# Patient Record
Sex: Male | Born: 1950 | Race: White | Hispanic: No | Marital: Married | Smoking: Former smoker
Health system: Southern US, Community
[De-identification: ages and names within clinical notes are randomized; demographics above are authoritative.]

## PROBLEM LIST (undated history)

## (undated) DIAGNOSIS — C61 Malignant neoplasm of prostate: Secondary | ICD-10-CM

## (undated) DIAGNOSIS — C439 Malignant melanoma of skin, unspecified: Secondary | ICD-10-CM

## (undated) HISTORY — DX: Malignant neoplasm of prostate: C61

## (undated) HISTORY — DX: Malignant melanoma of skin, unspecified: C43.9

---

## 2007-02-16 HISTORY — PX: JOINT REPLACEMENT: SHX530

## 2017-07-22 ENCOUNTER — Other Ambulatory Visit (HOSPITAL_COMMUNITY): Payer: Self-pay | Admitting: Urology

## 2017-07-22 DIAGNOSIS — R972 Elevated prostate specific antigen [PSA]: Secondary | ICD-10-CM

## 2017-07-28 ENCOUNTER — Telehealth: Payer: Self-pay | Admitting: *Deleted

## 2017-07-29 ENCOUNTER — Ambulatory Visit (HOSPITAL_COMMUNITY)
Admission: RE | Admit: 2017-07-29 | Discharge: 2017-07-29 | Disposition: A | Discharge status: Home / Self Care | Payer: Medicare HMO | Source: Ambulatory Visit | Attending: Urology | Admitting: Urology

## 2017-07-29 ENCOUNTER — Ambulatory Visit (HOSPITAL_COMMUNITY): Payer: Medicare HMO

## 2017-07-29 ENCOUNTER — Encounter (HOSPITAL_COMMUNITY): Payer: Self-pay

## 2017-07-29 DIAGNOSIS — N429 Disorder of prostate, unspecified: Secondary | ICD-10-CM | POA: Diagnosis not present

## 2017-07-29 DIAGNOSIS — K5730 Diverticulosis of large intestine without perforation or abscess without bleeding: Secondary | ICD-10-CM | POA: Insufficient documentation

## 2017-07-29 DIAGNOSIS — R972 Elevated prostate specific antigen [PSA]: Secondary | ICD-10-CM | POA: Diagnosis present

## 2017-07-29 LAB — CREATININE, SERUM
Creatinine, Ser: 1.07 mg/dL (ref 0.61–1.24)
GFR calc Af Amer: >60 mL/min (ref >60)

## 2017-07-29 MED ORDER — GADOBENATE DIMEGLUMINE 529 MG/ML IV SOLN
18.0000 mL | Freq: Once | INTRAVENOUS | Status: AC | PRN
  Administered 2017-07-29: 18 mL via INTRAVENOUS

## 2017-11-08 DIAGNOSIS — C61 Malignant neoplasm of prostate: Secondary | ICD-10-CM

## 2017-11-08 HISTORY — DX: Malignant neoplasm of prostate: C61

## 2017-11-08 HISTORY — PX: PROSTATE SURGERY: SHX751

## 2018-03-08 ENCOUNTER — Ambulatory Visit: Payer: Medicare HMO | Attending: Radiation Oncology | Admitting: Physical Therapy

## 2018-03-08 ENCOUNTER — Other Ambulatory Visit: Payer: Self-pay

## 2018-03-08 ENCOUNTER — Encounter: Payer: Self-pay | Admitting: Physical Therapy

## 2018-03-08 DIAGNOSIS — R278 Other lack of coordination: Secondary | ICD-10-CM | POA: Diagnosis present

## 2018-03-08 DIAGNOSIS — M6281 Muscle weakness (generalized): Secondary | ICD-10-CM | POA: Diagnosis present

## 2018-03-08 DIAGNOSIS — C61 Malignant neoplasm of prostate: Secondary | ICD-10-CM | POA: Diagnosis present

## 2018-03-08 NOTE — Patient Instructions (Addendum)
Certain foods and liquids will decrease the pH making the urine more acidic.  Urinary urgency increases when the urine has a low pH.  Most common irritants: alcohol, carbonated beverages and caffinated beverages.  Foods to avoid: apple juice, apples, ascorbic acid, canteloupes, chili, citrus fruits, coffee, cranberries, grapes, guava, peaches, pepper, pineapple, plums, strawberries, tea, tomatoes, and vinegar.  Drinking plenty of water may help to increase the pH and dilute out any of the effects of specific irritants.  Foods that are NOT irritating to the bladder include: Pears, papayas, sun-brewed teas, watermelons, non-citrus herbal teas, apricots, kava and low-acid instant drinks (Postum)  Scar Massage  Scar massage is done to improve the mobility of scar, decrease scar tissue from building up, reduce adhesions, and prevent Keloids from forming. Start scar massage after scabs have fallen off by themselves and no open areas. The first few weeks after surgery, it is normal for a scar to appear pink or red and slightly raised. Scars can itch or have areas of numbness. Some scars may be sensitive.   Direct Scar massage: after scar is healed, no opening, no scab 1.  Place pads of two fingers together directly on the scar starting at one end of the scar. Move the fingers up and down across the scar holding 5 seconds one direction.  Then go opposite direction hold 5 seconds.  2. Move over to the next section of the scar and repeat.  Work your way along the entire length of the scar.   3. Next make diagonal movements along the scar holding 5 seconds at one direction. 4. Next movement is side to side. 5. Do not rub fingers over the scar.  Instead keep firm pressure and move scar over the tissue it is on top   Scar Lift and Roll 12 weeks after surgery. 1. Pinch a small amount of the scar between your first two fingers and thumb.  2. Roll the scar between your fingers for 5 to 15 seconds. 3. Move  along the scar and repeat until you have massaged the entire length of scar.   Stop the massage and call your doctor if you notice: 1. Increased redness 2. Bleeding from scar 3. Seepage coming from the scar 4. Scar is warmer and has increased pain   Slow Contraction: Gravity Resisted (Sitting)    Sitting, slowly squeeze pelvic floor for _5__ seconds. Rest for _5__ seconds. Repeat __5_ times. Do _5__ times a day.  Copyright  VHI. All rights reserved.  Slow Contraction: Gravity Resisted (Sitting)    Sitting, slowly squeeze pelvic floor for __1_ seconds. Rest for _1__ seconds. Repeat _5__ times. Do __5_ times a day.  Copyright  VHI. All rights reserved.  [facility] [address] [place], [place] [address] Phone # [phone] Fax [phone]

## 2018-03-08 NOTE — Therapy (Signed)
[facility] [address] [place], [place], [address] Phone: [phone]   Fax:  [phone]  Physical Therapy Evaluation  Patient Details  Name: Justin Oliver MRN: [id number] Date of Birth: [birth year] Referring Provider (PT): Dr. Thea Silversmith   Encounter Date: 03/08/2018  PT End of Session - 03/08/18 1153    Visit Number  1    Date for PT Re-Evaluation  05/03/18    Authorization Type  [employer]na Medicare    PT Start Time  1100    PT Stop Time  1145    PT Time Calculation (min)  45 min    Activity Tolerance  Patient tolerated treatment well    Behavior During Therapy  [facility] for tasks assessed/performed       Past Medical History:  Diagnosis Date  . Melanoma ([place])   . Prostate CA ([place]) 11/08/2017    Past Surgical History:  Procedure Laterality Date  . JOINT REPLACEMENT Left 2009   TKA  . PROSTATE SURGERY  11/08/2017   prostate cancer    There were no vitals filed for this visit.   Subjective Assessment - 03/08/18 1109    Subjective  Patient had prostectomy on 11/08/2017. No physical therapy afterwards. Dr. Jonette Eva performed robotic surgery. Patient decided to postpone radiation. Patient has a trace of PSA. Patient has urgency. If patient waits to go to  bathroom will leak urine. Patient wears disposable underwear, 1 per day, 1 at night. No urinary leakage at night.     Patient Stated Goals  strengthen the pelvic floor muscles    Currently in Pain?  No/denies    Multiple Pain Sites  No         [facility] PT Assessment - 03/08/18 0001      Assessment   Medical Diagnosis  C61 malignant neoplasm of prostate    Referring Provider (PT)  Dr. Thea Silversmith    Onset Date/Surgical Date  11/08/17    Prior Therapy  none      Precautions   Precautions  Other (comment)    Precaution Comments  cancer -prostate, melanoma      Restrictions   Weight Bearing Restrictions  No      Balance Screen   Has the patient  fallen in the past 6 months  No    Has the patient had a decrease in activity level because of a fear of falling?   No    Is the patient reluctant to leave their home because of a fear of falling?   No      Home Film/video editor residence      Prior Function   Level of Independence  Independent    Vocation Requirements  sitting    Leisure  golf      ROM / Strength   AROM / PROM / Strength  Strength      Strength   Left Hip Flexion  4/5    Left Hip Extension  4/5                Objective measurements completed on examination: See above findings.    Pelvic Floor Special Questions - 03/08/18 0001    Urinary Leakage  Yes    Pad use  2 disposable diapers    Activities that cause leaking  With strong urge    Urinary urgency  Yes    Biofeedback  sitting quick flick to 30 uv, hold 5 sec 15  uv with VC to breath    Biofeedback sensor type  Surface   rectal              PT Education - 03/08/18 1154    Education Details  information on bladder irritants, scar massage, pelvic floor contraction  in sitting    Person(s) Educated  Patient    Methods  Explanation;Demonstration;Verbal cues;Handout    Comprehension  Returned demonstration;Verbalized understanding       PT Short Term Goals - 03/08/18 1207      PT SHORT TERM GOAL #1   Title  independent with initial HEP    Time  4    Period  Weeks    Status  New    Target Date  05/03/18      PT SHORT TERM GOAL #2   Title  understand what bladder irritants are and how they affect the bladder and urinary leakage    Time  4    Period  Weeks    Status  New    Target Date  04/05/18      PT SHORT TERM GOAL #3   Title  urinary leakage when he has the urge decreased >/= 25%     Time  4    Period  Weeks    Status  New    Target Date  04/05/18      PT SHORT TERM GOAL #4   Title  abilty to hold 5 second pelvic floor contraction in sitting with correct breathing pattern    Time  4     Period  Weeks    Status  New    Target Date  04/05/18      PT SHORT TERM GOAL #5   Title  ability to go from sit to stand with correct breathing pattern so he is not bearing down causing urinary leakage    Time  4    Period  Weeks    Status  New    Target Date  04/05/18        PT Long Term Goals - 03/08/18 1212      PT LONG TERM GOAL #1   Title  independent with HEP and understand how to progress    Time  8    Period  Weeks    Status  New    Target Date  05/03/18      PT LONG TERM GOAL #2   Title  urinary leakage with going from sit to stand when he has the strong urge to urinate decreased >/= 80%    Time  8    Period  Weeks    Status  New    Target Date  05/03/18      PT LONG TERM GOAL #3   Title  abilty to walk to the bathroom when he has a strong urge to urinate decreased >/= 80% due to increased endurance of pelvic floor muscles    Time  8    Period  Weeks    Status  New    Target Date  05/03/18      PT LONG TERM GOAL #4   Title  ability to hold his pelvic floor for 30 seconds while breathing above 15 uv    Time  8    Period  Weeks    Status  New    Target Date  05/03/18             Plan - 03/08/18 1229  Clinical Impression Statement  Patient is a 68 year old male s/p prostectomy, 11/08/2017,  due to prostate cancer. Patient reports urinary leakage when he has the urge to urinate and going from sit to stand and walking to the commode. Patient reports he will leak urine when he drinks caffiene or alcoholic beverage. Patient has weakness in left hip extension and flexion. Patient abdominal scars have thickness and decreased mobility. Patient pelvic floor contracts to 10XN with quick flicks and 15 uv with 5 second holds. Patient needs verbal cues to breath with pelvic floor contraction for 5seconds. Patient wears 2 disposable diapers during the day and no leakage at night. Patient will benefit from skilled therapy to improve pelvic floor coordination and  strength to reduce leakage.     History and Personal Factors relevant to plan of care:  prostate cancer; melanoma, prostectomy 11/08/2017    Clinical Presentation  Evolving    Clinical Presentation due to:  leaks urine when he has strong urge, going to sit to stand and walking to the commode    Clinical Decision Making  Moderate    Rehab Potential  Excellent    Clinical Impairments Affecting Rehab Potential  prostate cancer; melanoma, prostectomy 11/08/2017    PT Frequency  Biweekly    PT Duration  8 weeks    PT Treatment/Interventions  Biofeedback;Therapeutic activities;Therapeutic exercise;Neuromuscular re-education;Manual techniques;Patient/family education;Scar mobilization;Dry needling    PT Next Visit Plan  soft tissue work to abdomen and diaphragm, pelvic floor contraction for 10 seconds, sit to stand and walking, core strength    Consulted and Agree with Plan of Care  Patient       Patient will benefit from skilled therapeutic intervention in order to improve the following deficits and impairments:  Increased fascial restricitons, Decreased coordination, Decreased scar mobility, Decreased endurance, Decreased strength  Visit Diagnosis: Muscle weakness (generalized) - Plan: PT plan of care cert/re-cert  Other lack of coordination - Plan: PT plan of care cert/re-cert  Prostate cancer [facility]) - Plan: PT plan of care cert/re-cert     Problem List There are no active problems to display for this patient.   Earlie Counts, PT 03/08/18 12:39 PM   [facility] [address], [place], [place], [address] Phone: [phone]   Fax:  [phone]  Name: Justin Oliver MRN: [id number] Date of Birth: 06-29-1950

## 2018-03-21 ENCOUNTER — Encounter: Payer: Self-pay | Admitting: Physical Therapy

## 2018-03-21 ENCOUNTER — Ambulatory Visit: Payer: Medicare HMO | Attending: Radiation Oncology | Admitting: Physical Therapy

## 2018-03-21 DIAGNOSIS — C61 Malignant neoplasm of prostate: Secondary | ICD-10-CM | POA: Insufficient documentation

## 2018-03-21 DIAGNOSIS — M6281 Muscle weakness (generalized): Secondary | ICD-10-CM

## 2018-03-21 DIAGNOSIS — R278 Other lack of coordination: Secondary | ICD-10-CM | POA: Insufficient documentation

## 2018-03-21 NOTE — Patient Instructions (Addendum)
About Abdominal Massage  Abdominal massage, also called external colon massage, is a self-treatment circular massage technique that can reduce and eliminate gas and ease constipation. The colon naturally contracts in waves in a clockwise direction starting from inside the right hip, moving up toward the ribs, across the belly, and down inside the left hip.  When you perform circular abdominal massage, you help stimulate your colon's normal wave pattern of movement called peristalsis.  It is most beneficial when done after eating.  Positioning You can practice abdominal massage with oil while lying down, or in the shower with soap.  Some people find that it is just as effective to do the massage through clothing while sitting or standing.  How to Massage Start by placing your finger tips or knuckles on your right side, just inside your hip bone.  . Make small circular movements while you move upward toward your rib cage.   . Once you reach the bottom right side of your rib cage, take your circular movements across to the left side of the bottom of your rib cage.  . Next, move downward until you reach the inside of your left hip bone.  This is the path your feces travel in your colon. . Continue to perform your abdominal massage in this pattern for 10 minutes each day.     You can apply as much pressure as is comfortable in your massage.  Start gently and build pressure as you continue to practice.  Notice any areas of pain as you massage; areas of slight pain may be relieved as you massage, but if you have areas of significant or intense pain, consult with your healthcare provider.  Other Considerations . General physical activity including bending and stretching can have a beneficial massage-like effect on the colon.  Deep breathing can also stimulate the colon because breathing deeply activates the same nervous system that supplies the colon.   . Abdominal massage should always be used in  combination with a bowel-conscious diet that is high in the proper type of fiber for you, fluids (primarily water), and a regular exercise program. Toileting Techniques for Bowel Movements (Defecation) Using your belly (abdomen) and pelvic floor muscles to have a bowel movement is usually instinctive.  Sometimes people can have problems with these muscles and have to relearn proper defecation (emptying) techniques.  If you have weakness in your muscles, organs that are falling out, decreased sensation in your pelvis, or ignore your urge to go, you may find yourself straining to have a bowel movement.  You are straining if you are: . holding your breath or taking in a huge gulp of air and holding it  . keeping your lips and jaw tensed and closed tightly . turning red in the face because of excessive pushing or forcing . developing or worsening your  hemorrhoids . getting faint while pushing . not emptying completely and have to defecate many times a day  If you are straining, you are actually making it harder for yourself to have a bowel movement.  Many people find they are pulling up with the pelvic floor muscles and closing off instead of opening the anus. Due to lack pelvic floor relaxation and coordination the abdominal muscles, one has to work harder to push the feces out.  Many people have never been taught how to defecate efficiently and effectively.  Notice what happens to your body when you are having a bowel movement.  While you are sitting on the   toilet pay attention to the following areas: . Jaw and mouth position . Angle of your hips   . Whether your feet touch the ground or not . Arm placement  . Spine position . Waist . Belly tension . Anus (opening of the anal canal)  An Evacuation/Defecation Plan   Here are the 4 basic points:  1. Lean forward enough for your elbows to rest on your knees 2. Support your feet on the floor or use a low stool if your feet don't touch the floor    3. Push out your belly as if you have swallowed a beach ball-you should feel a widening of your waist 4. Open and relax your pelvic floor muscles, rather than tightening around the anus      The following conditions my require modifications to your toileting posture:  . If you have had surgery in the past that limits your back, hip, pelvic, knee or ankle flexibility . Constipation   Your healthcare practitioner may make the following additional suggestions and adjustments:  1) Sit on the toilet  a) Make sure your feet are supported. b) Notice your hip angle and spine position-most people find it effective to lean forward or raise their knees, which can help the muscles around the anus to relax  c) When you lean forward, place your forearms on your thighs for support  2) Relax suggestions a) Breath deeply in through your nose and out slowly through your mouth as if you are smelling the flowers and blowing out the candles. b) To become aware of how to relax your muscles, contracting and releasing muscles can be helpful.  Pull your pelvic floor muscles in tightly by using the image of holding back gas, or closing around the anus (visualize making a circle smaller) and lifting the anus up and in.  Then release the muscles and your anus should drop down and feel open. Repeat 5 times ending with the feeling of relaxation. c) Keep your pelvic floor muscles relaxed; let your belly bulge out. d) The digestive tract starts at the mouth and ends at the anal opening, so be sure to relax both ends of the tube.  Place your tongue on the roof of your mouth with your teeth separated.  This helps relax your mouth and will help to relax the anus at the same time.  3) Empty (defecation) a) Keep your pelvic floor and sphincter relaxed, then bulge your anal muscles.  Make the anal opening wide.  b) Stick your belly out as if you have swallowed a beach ball. c) Make your belly wall hard using your belly  muscles while continuing to breathe. Doing this makes it easier to open your anus. d) Breath out and give a grunt (or try using other sounds such as ahhhh, shhhhh, ohhhh or grrrrrrr).  4) Finish a) As you finish your bowel movement, pull the pelvic floor muscles up and in.  This will leave your anus in the proper place rather than remaining pushed out and down. If you leave your anus pushed out and down, it will start to feel as though that is normal and give you incorrect signals about needing to have a bowel movement.    Slow Contraction: Gravity Resisted (Sitting)    Sitting, slowly squeeze pelvic floor for _10_ seconds. Rest for _5__ seconds. Repeat __5_ times. Do _5__ times a day.  Copyright  VHI. All rights reserved.  Slow Contraction: Gravity Resisted (Sitting)    Sitting, slowly squeeze  pelvic floor for __1_ seconds. Rest for _1__ seconds. Repeat _5__ times. Do __5_ times a day.   Slow Contraction: Gravity Resisted (Standing)    Standing, slowly squeeze pelvic floor for _5__ seconds. Rest for _5__ seconds. Repeat _5__ times. Do __2_ times a day.  Copyright  VHI. All rights reserved.  [facility] [address], [place], [place] [address] Phone # [phone] Fax [phone]

## 2018-03-21 NOTE — Therapy (Signed)
[facility] [address], [place], [place], [address] Phone: [phone]   Fax:  [phone]  Physical Therapy Treatment  Patient Details  Name: Justin Oliver MRN: [id number] Date of Birth: 04-18-1950 Referring Provider (PT): Dr. Thea Silversmith   Encounter Date: 03/21/2018  PT End of Session - 03/21/18 1216    Visit Number  2    Date for PT Re-Evaluation  05/03/18    Authorization Type  [employer]na Medicare    PT Start Time  7412    PT Stop Time  1100    PT Time Calculation (min)  45 min    Activity Tolerance  Patient tolerated treatment well    Behavior During Therapy  [facility] for tasks assessed/performed       Past Medical History:  Diagnosis Date  . Melanoma ([place])   . Prostate CA ([place]) 11/08/2017    Past Surgical History:  Procedure Laterality Date  . JOINT REPLACEMENT Left 2009   TKA  . PROSTATE SURGERY  11/08/2017   prostate cancer    There were no vitals filed for this visit.  Subjective Assessment - 03/21/18 1021    Subjective  I have reduce my coffee intake and it is helping. My urinary leakage is 40% better. I can sleep through the night. Patient can eliminate but not alot comes out. Patient gets up 2  times at night to go to the bathroom. Patient can make it to the bathroom without leakage.     Patient Stated Goals  strengthen the pelvic floor muscles    Currently in Pain?  No/denies    Multiple Pain Sites  No                       [facility] Adult PT Treatment/Exercise - 03/21/18 0001      Self-Care   Self-Care  Other Self-Care Comments    Other Self-Care Comments   discussed caffiene being a bladder irritant      Therapeutic Activites    Therapeutic Activities  Other Therapeutic Activities    Other Therapeutic Activities  toileting technique to improve constipation with correct breathing and sitting position      Neuro Re-ed    Neuro Re-ed Details   pelvic floor contraction in sitting  and standing with VC to breath and upright for best contraction; supine diaphgramatic breathing to expand the abdomen and move the diaphgram up and down.       Manual Therapy   Manual Therapy  Soft tissue mobilization    Soft tissue mobilization  scar massage to abdomen; soft tissue work to the diaphgram; abdominal massage to improve peristalic movement             PT Education - 03/21/18 1104    Education Details  pelvic floor contraction; abdominal massage; toileting technique    Person(s) Educated  Patient    Methods  Explanation;Demonstration;Verbal cues;Handout    Comprehension  Returned demonstration;Verbalized understanding       PT Short Term Goals - 03/21/18 1220      PT SHORT TERM GOAL #1   Title  independent with initial HEP    Time  4    Period  Weeks    Status  Achieved      PT SHORT TERM GOAL #2   Title  understand what bladder irritants are and how they affect the bladder and urinary leakage    Time  4    Period  Weeks  Status  Achieved      PT SHORT TERM GOAL #3   Title  urinary leakage when he has the urge decreased >/= 25%     Time  4    Period  Weeks    Status  On-going      PT SHORT TERM GOAL #4   Title  abilty to hold 5 second pelvic floor contraction in sitting with correct breathing pattern    Time  4    Period  Weeks    Status  On-going      PT SHORT TERM GOAL #5   Title  ability to go from sit to stand with correct breathing pattern so he is not bearing down causing urinary leakage    Time  4    Period  Weeks    Status  On-going        PT Long Term Goals - 03/08/18 1212      PT LONG TERM GOAL #1   Title  independent with HEP and understand how to progress    Time  8    Period  Weeks    Status  New    Target Date  05/03/18      PT LONG TERM GOAL #2   Title  urinary leakage with going from sit to stand when he has the strong urge to urinate decreased >/= 80%    Time  8    Period  Weeks    Status  New    Target Date   05/03/18      PT LONG TERM GOAL #3   Title  abilty to walk to the bathroom when he has a strong urge to urinate decreased >/= 80% due to increased endurance of pelvic floor muscles    Time  8    Period  Weeks    Status  New    Target Date  05/03/18      PT LONG TERM GOAL #4   Title  ability to hold his pelvic floor for 30 seconds while breathing above 15 uv    Time  8    Period  Weeks    Status  New    Target Date  05/03/18            Plan - 03/21/18 1216    Clinical Impression Statement  Patient is able to stay dry during the night. Patient will get up 2 times due to the urgency at night. Patient reports he is able to get to the bathroom in time without leakage at night. Patient leaks urine with coffee and is aware that it is a bladder irritant for him. Patient is now contracting the pelvic floor in standing and holding for 10 seconds in sitting but has some difficulty. Patient is aware of his posture with pelvic floor contraction. Patient will benefit from skilled therapy to improve pelvic floor coordinaiton and strength to reduce leakage.     Rehab Potential  Excellent    Clinical Impairments Affecting Rehab Potential  prostate cancer; melanoma, prostectomy 11/08/2017    PT Frequency  Biweekly    PT Duration  8 weeks    PT Treatment/Interventions  Biofeedback;Therapeutic activities;Therapeutic exercise;Neuromuscular re-education;Manual techniques;Patient/family education;Scar mobilization;Dry needling    PT Next Visit Plan  soft tissue work to abdomen and diaphragm, sit to stand and walking with pelvic EMG core strength; urge to void    Consulted and Agree with Plan of Care  Patient       Patient  will benefit from skilled therapeutic intervention in order to improve the following deficits and impairments:  Increased fascial restricitons, Decreased coordination, Decreased scar mobility, Decreased endurance, Decreased strength  Visit Diagnosis: Muscle weakness  (generalized)  Other lack of coordination  Prostate cancer ([place])     Problem List There are no active problems to display for this patient.   Earlie Counts, PT 03/21/18 12:22 PM    [facility] [address] [place], [place], [address] Phone: [phone]   Fax:  [phone]  Name: Enio Hornback MRN: [id number] Date of Birth: 1950/10/22

## 2018-04-04 ENCOUNTER — Ambulatory Visit: Payer: Medicare HMO | Admitting: Physical Therapy

## 2018-04-04 ENCOUNTER — Encounter: Payer: Self-pay | Admitting: Physical Therapy

## 2018-04-04 DIAGNOSIS — C61 Malignant neoplasm of prostate: Secondary | ICD-10-CM

## 2018-04-04 DIAGNOSIS — R278 Other lack of coordination: Secondary | ICD-10-CM

## 2018-04-04 DIAGNOSIS — M6281 Muscle weakness (generalized): Secondary | ICD-10-CM

## 2018-04-04 NOTE — Patient Instructions (Addendum)
Relaxation Exercises with the Urge to Void   When you experience an urge to void:  FIRST  Stop and stand very still    Sit down if you can    Don't move    You need to stay very still to maintain control  SECOND Squeeze your pelvic floor muscles 5 times, like a quick flick, to keep from leaking  THIRD Relax  Take a deep breath and then let it out  Try to make the urge go away by using relaxation and visualization techniques  FINALLY When you feel the urge go away somewhat, walk normally to the bathroom.   If the urge gets suddenly stronger on the way, you may stop again and relax to regain control.  Hook-Lying    Lie with hips and knees bent. Allow body's muscles to relax. Place hands on belly. Inhale slowly and deeply for _3__ seconds, so hands move up. Then take __3_ seconds to exhale. Repeat _5__ times. When you have the urge to void.  Copyright  VHI. All rights reserved.  Sitting    Sit comfortably. Allow body's muscles to relax. Place hands on belly. Inhale slowly and deeply for __3_ seconds, so hands move out. Then take _3__ seconds to exhale. Repeat __5_ times. When you have the urge to void. Copyright  VHI. All rights reserved.  Slow Contraction: Gravity Resisted (Sitting)    Sitting, slowly squeeze pelvic floor for __10_ seconds. Rest for _5__ seconds. Repeat _5__ times. Do __5_ times a day. Eventually to so 30 seconds. Sit upright.  End with 5 quick flicks.  Slow Contraction: Gravity Resisted (Standing)    Standing, slowly squeeze pelvic floor for _10__ seconds. Rest for _5__ seconds. Repeat _5__ times. Do __2_ times a day. Contract to 50% to keep endurance.  End with 5 quick flicks.  Bracing With Curl-Up (Hook-Lying)    With neutral spine, tighten pelvic floor and abdominals and hold. Lift head and shoulders. Breath out  Repeat __10_ times. Do __1_ times a day. Alternate arm positions: Fold arms across chest. Support neck with clasped hands. Do  this with your trainer    Copyright  VHI. All rights reserved.  Bracing With Diagonal Curl-Up (Hook-Lying)    With neutral spine, tighten pelvic floor and abdominals and hold. Lift head and shoulder and rotate to one side. Breath out. Repeat _10__ times, alternating sides. Do _1__ times a day. Alternate arm positions: Fold arms across chest. Support neck with clasped hands. Do when you workout with your trainer.   Copyright  VHI. All rights reserved.  Side Bend: Caudal - Side-Lying    Lying on right hip and elbow, lift pelvis, keeping elbow and feet on board. Hold for 10 sec Repeat __3__ times per set. Do ___1_ sets per session. Do _1___ sessions per week. Contract pelvic floor, Keep mouth open to breath Copyright  VHI. All rights reserved.  Plank    Support body on hands and feet. Keep hips in line with torso and arms straight under chest. Avoid locking elbows. Contract pelvic floor. Keep mouth open to breath.  Hold for ____ breaths. Repeat with other leg. ADVANCED: Extend one leg up.  Copyright  VHI. All rights reserved.

## 2018-04-04 NOTE — Therapy (Signed)
[facility] [address] [place], [place], [address] Phone: [phone]   Fax:  [phone]  Physical Therapy Treatment  Patient Details  Name: Justin Oliver MRN: [id number] Date of Birth: 16-Feb-1951 Referring Provider (PT): Dr. Thea Silversmith   Encounter Date: 04/04/2018  PT End of Session - 04/04/18 1059    Visit Number  3    Date for PT Re-Evaluation  05/03/18    Authorization Type  [employer]na Medicare    PT Start Time  8416    PT Stop Time  1053    PT Time Calculation (min)  38 min    Activity Tolerance  Patient tolerated treatment well    Behavior During Therapy  [facility] for tasks assessed/performed       Past Medical History:  Diagnosis Date  . Melanoma ([place])   . Prostate CA ([place]) 11/08/2017    Past Surgical History:  Procedure Laterality Date  . JOINT REPLACEMENT Left 2009   TKA  . PROSTATE SURGERY  11/08/2017   prostate cancer    There were no vitals filed for this visit.  Subjective Assessment - 04/04/18 1019    Subjective  Overall the leakage is improving is 50%. Patient trigger is for urinary leakage is worse with drinking alchol and caffiene. I workout at the gym 2 days per week.      Patient Stated Goals  strengthen the pelvic floor muscles    Currently in Pain?  No/denies    Multiple Pain Sites  No                       [facility] Adult PT Treatment/Exercise - 04/04/18 0001      Self-Care   Self-Care  Other Self-Care Comments    Other Self-Care Comments   urge to void and use breathing to calm the bladder down      Lumbar Exercises: Standing   Other Standing Lumbar Exercises  pelvic floor contraction holding for 10 seconds      Lumbar Exercises: Seated   Other Seated Lumbar Exercises  pelvic floor contraction holding for 10 seconds      Lumbar Exercises: Supine   Ab Set  10 reps;5 seconds   tactile cues    Other Supine Lumbar Exercises  curl-up and diagonal  curl up with  pelvic floor contraction, breathing and  contracting the lower abdominal      Lumbar Exercises: Prone   Other Prone Lumbar Exercises  Plank and side plank with core stabilization and pelvic floro contraction              PT Education - 04/04/18 1023    Education Details  educated patient on doing 30 minutes of cardio with his workout. pelvic floor contraction in sitting and standing holding for 10 seconds; Planks with pelvic floor contraction; curl up with pelvic floor contraction    Person(s) Educated  Patient    Methods  Explanation;Demonstration;Verbal cues;Handout    Comprehension  Verbalized understanding;Returned demonstration       PT Short Term Goals - 04/04/18 1103      PT SHORT TERM GOAL #3   Title  urinary leakage when he has the urge decreased >/= 25%     Time  4    Period  Weeks    Status  Achieved      PT SHORT TERM GOAL #4   Title  abilty to hold 5 second pelvic floor contraction in sitting with correct  breathing pattern    Time  4    Period  Weeks    Status  Achieved      PT SHORT TERM GOAL #5   Title  ability to go from sit to stand with correct breathing pattern so he is not bearing down causing urinary leakage    Time  4    Period  Weeks    Status  Achieved        PT Long Term Goals - 03/08/18 1212      PT LONG TERM GOAL #1   Title  independent with HEP and understand how to progress    Time  8    Period  Weeks    Status  New    Target Date  05/03/18      PT LONG TERM GOAL #2   Title  urinary leakage with going from sit to stand when he has the strong urge to urinate decreased >/= 80%    Time  8    Period  Weeks    Status  New    Target Date  05/03/18      PT LONG TERM GOAL #3   Title  abilty to walk to the bathroom when he has a strong urge to urinate decreased >/= 80% due to increased endurance of pelvic floor muscles    Time  8    Period  Weeks    Status  New    Target Date  05/03/18      PT LONG TERM GOAL #4   Title  ability  to hold his pelvic floor for 30 seconds while breathing above 15 uv    Time  8    Period  Weeks    Status  New    Target Date  05/03/18            Plan - 04/04/18 1030    Clinical Impression Statement  Patient will leak urine when he drinks coffee and alcohol. Patient continues to get the urge to urinate and learned techniques to reduce. Patient urinary leakage decreased by 50%. Patient has learned how to work with the trainer while engaging the pelvic floor and contracting the abdominals correctly. Patient will benefit from skilled therapy to improve pelvic floor coordination and strength to reduce leakage.     Rehab Potential  Excellent    Clinical Impairments Affecting Rehab Potential  prostate cancer; melanoma, prostectomy 11/08/2017    PT Frequency  Biweekly    PT Treatment/Interventions  Biofeedback;Therapeutic activities;Therapeutic exercise;Neuromuscular re-education;Manual techniques;Patient/family education;Scar mobilization;Dry needling    PT Next Visit Plan  longer pelvic floor contraction to 15 seconds, urge to void    Consulted and Agree with Plan of Care  Patient       Patient will benefit from skilled therapeutic intervention in order to improve the following deficits and impairments:  Increased fascial restricitons, Decreased coordination, Decreased scar mobility, Decreased endurance, Decreased strength  Visit Diagnosis: Muscle weakness (generalized)  Other lack of coordination  Prostate cancer ([place])     Problem List There are no active problems to display for this patient.   Earlie Counts, PT 04/04/18 11:04 AM   [facility] [address], [place], [place], [address] Phone: [phone]   Fax:  [phone]  Name: Justin Oliver MRN: [id number] Date of Birth: April 24, 1950

## 2018-04-18 ENCOUNTER — Ambulatory Visit: Payer: Medicare HMO | Attending: Radiation Oncology | Admitting: Physical Therapy

## 2018-04-18 ENCOUNTER — Encounter: Payer: Self-pay | Admitting: Physical Therapy

## 2018-04-18 DIAGNOSIS — M6281 Muscle weakness (generalized): Secondary | ICD-10-CM | POA: Diagnosis present

## 2018-04-18 DIAGNOSIS — C61 Malignant neoplasm of prostate: Secondary | ICD-10-CM | POA: Insufficient documentation

## 2018-04-18 DIAGNOSIS — R278 Other lack of coordination: Secondary | ICD-10-CM | POA: Diagnosis present

## 2018-04-18 NOTE — Patient Instructions (Addendum)
Relaxation Exercises with the Urge to Void   When you experience an urge to void:  FIRST              Stop and stand very still                                     Sit down if you can                                     Don't move                                     You need to stay very still to maintain control  SECOND         Squeeze your pelvic floor muscles 5 times, like a quick flick, to keep from leaking  THIRD Relax             Take a deep breath and then let it out             Try to make the urge go away by using relaxation and visualization techniques  FINALLY         When you feel the urge go away somewhat, walk normally to the bathroom. If the urge gets suddenly stronger on the way, you may stop again and relax to regain control.       Slow Contraction: Gravity Resisted (Sitting)    Sitting, slowly squeeze pelvic floor for _15__ seconds work toward 30 seconds. Rest for _5__ seconds. Repeat __5_ times. Do __2_ times a day. End with 5 quick flicks Copyright  VHI. All rights reserved.    Slow Contraction: Gravity Resisted (Standing)    Standing, slowly squeeze pelvic floor for _10__ seconds work toward 30 sec. Rest for _5__ seconds. Repeat _5__ times. Do __2_ times a day. End off with 5 quick flicks Copyright  VHI. All rights reserved.   Breath with all strenuous activity   [facility] [address], [place], [place] [address] Phone # [phone] Fax [phone]

## 2018-04-18 NOTE — Therapy (Signed)
[facility] [address], [place], [place], [address] Phone: [phone]   Fax:  [phone]  Physical Therapy Treatment  Patient Details  Name: Justin Oliver MRN: [id number] Date of Birth: Nov 01, 1950 Referring Provider (PT): Dr. Thea Silversmith   Encounter Date: 04/18/2018  PT End of Session - 04/18/18 1042    Visit Number  4    Date for PT Re-Evaluation  05/03/18    Authorization Type  Aetna Medicare    PT Start Time  2119    PT Stop Time  1040    PT Time Calculation (min)  25 min    Activity Tolerance  Patient tolerated treatment well    Behavior During Therapy  [facility] for tasks assessed/performed       Past Medical History:  Diagnosis Date  . Melanoma ([place])   . Prostate CA ([place]) 11/08/2017    Past Surgical History:  Procedure Laterality Date  . JOINT REPLACEMENT Left 2009   TKA  . PROSTATE SURGERY  11/08/2017   prostate cancer    There were no vitals filed for this visit.  Subjective Assessment - 04/18/18 1020    Subjective  Today is my last day. The urgency is better with the medication.     Patient Stated Goals  strengthen the pelvic floor muscles    Currently in Pain?  No/denies         [facility] PT Assessment - 04/18/18 0001      Assessment   Medical Diagnosis  C61 malignant neoplasm of prostate    Referring Provider (PT)  Dr. Thea Silversmith    Onset Date/Surgical Date  11/08/17    Prior Therapy  none      Precautions   Precautions  Other (comment)    Precaution Comments  cancer -prostate, melanoma      Restrictions   Weight Bearing Restrictions  No      [place] residence      Prior Function   Level of Independence  Independent    Vocation Requirements  sitting    Leisure  golf      Cognition   Overall Cognitive Status  Within Functional Limits for tasks assessed      Posture/Postural Control   Posture/Postural Control  No significant  limitations      ROM / Strength   AROM / PROM / Strength  Strength      Strength   Left Hip Flexion  5/5    Left Hip Extension  5/5                Pelvic Floor Special Questions - 04/18/18 0001    Pad use  1 per day just in case    Activities that cause leaking  With strong urge   improved by 70%       [facility] Adult PT Treatment/Exercise - 04/18/18 0001      Self-Care   Self-Care  Other Self-Care Comments    Other Self-Care Comments   urge to void and use breathing to calm the bladder down; dicussed caffeinie and using decaf coffee      Neuro Re-ed    Neuro Re-ed Details   pelvic floor contractionin sitting and standing working toward 30 second holds             PT Education - 04/18/18 1041    Education Details  pelvic floor contraction in sitting and standing, reviewed urge to void and  caffiene    Methods  Explanation;Demonstration;Handout    Comprehension  Verbalized understanding;Returned demonstration       PT Short Term Goals - 04/18/18 1021      PT SHORT TERM GOAL #1   Title  independent with initial HEP    Time  4    Period  Weeks    Status  Achieved      PT SHORT TERM GOAL #2   Title  understand what bladder irritants are and how they affect the bladder and urinary leakage    Time  4    Period  Weeks    Status  Achieved      PT SHORT TERM GOAL #3   Title  urinary leakage when he has the urge decreased >/= 25%     Period  Weeks    Status  Achieved      PT SHORT TERM GOAL #4   Title  abilty to hold 5 second pelvic floor contraction in sitting with correct breathing pattern    Time  4    Period  Weeks    Status  Achieved      PT SHORT TERM GOAL #5   Title  ability to go from sit to stand with correct breathing pattern so he is not bearing down causing urinary leakage    Time  4    Period  Weeks    Status  Achieved        PT Long Term Goals - 04/18/18 1021      PT LONG TERM GOAL #1   Title  independent with HEP and understand  how to progress    Time  8    Period  Weeks    Status  Achieved      PT LONG TERM GOAL #2   Title  urinary leakage with going from sit to stand when he has the strong urge to urinate decreased >/= 80%    Time  8    Period  Weeks    Status  Achieved      PT LONG TERM GOAL #3   Title  abilty to walk to the bathroom when he has a strong urge to urinate decreased >/= 80% due to increased endurance of pelvic floor muscles    Time  8    Period  Weeks    Status  Achieved      PT LONG TERM GOAL #4   Title  ability to hold his pelvic floor for 30 seconds while breathing above 15 uv    Time  8    Period  Weeks    Status  Achieved            Plan - 04/18/18 1042    Clinical Impression Statement  Patient has increased strength of hips. Patient still wears a depends just in case while at work. Patient is working with a urologist on the urge to void. Patient is taking medication for urge to void and has decreased it. Patient is independent  with his HEP and working with a Clinical research associate. Patient is ready for discharge.     Rehab Potential  Excellent    Clinical Impairments Affecting Rehab Potential  prostate cancer; melanoma, prostectomy 11/08/2017    PT Treatment/Interventions  Biofeedback;Therapeutic activities;Therapeutic exercise;Neuromuscular re-education;Manual techniques;Patient/family education;Scar mobilization;Dry needling    PT Next Visit Plan  Discharge to HEP this visit    PT Home Exercise Plan  Current HEP    Recommended Other Services  MD  signed initial eval    Consulted and Agree with Plan of Care  Patient       Patient will benefit from skilled therapeutic intervention in order to improve the following deficits and impairments:  Increased fascial restricitons, Decreased coordination, Decreased scar mobility, Decreased endurance, Decreased strength  Visit Diagnosis: Muscle weakness (generalized)  Other lack of coordination  Prostate cancer ([place])     Problem List There  are no active problems to display for this patient.   Earlie Counts, PT 04/18/18 10:46 AM   [facility] [address] [place], [place], [address] Phone: [phone]   Fax:  [phone]  Name: Jerone Cudmore MRN: [id number] Date of Birth: 1950-12-09  PHYSICAL THERAPY DISCHARGE SUMMARY  Visits from Start of Care: 4  Current functional level related to goals / functional outcomes: See above.    Remaining deficits: See above.    Education / Equipment: HEP Plan: Patient agrees to discharge.  Patient goals were met. Patient is being discharged due to meeting the stated rehab goals. Thank you for referral. Earlie Counts, PT 04/18/18 10:46 AM   ?????

## 2018-05-02 ENCOUNTER — Encounter: Payer: Medicare HMO | Admitting: Physical Therapy

## 2018-05-16 ENCOUNTER — Encounter: Payer: Medicare HMO | Admitting: Physical Therapy

## 2018-05-30 ENCOUNTER — Encounter: Payer: Medicare HMO | Admitting: Physical Therapy

## 2019-01-29 IMAGING — MR MR PROSTATE WO/W CM
55 series · 55 of 55 positions shown · IV contrast (multihance)
Comparison: Report from CT abdomen dated 06/16/2005

CLINICAL DATA: Elevated PSA level

EXAM:
MR PROSTATE WITHOUT AND WITH CONTRAST
TECHNIQUE: Multiplanar multisequence MRI images were obtained of the pelvis
centered about the prostate. Pre and post contrast images were
obtained.
CONTRAST:  18mL MULTIHANCE GADOBENATE DIMEGLUMINE 529 MG/ML IV SOLN

[Series 3: T1 · axial · 8.0mm · 0.78mm/px · 1 of 28 slices shown (1 of 2)]
[im 1/28]
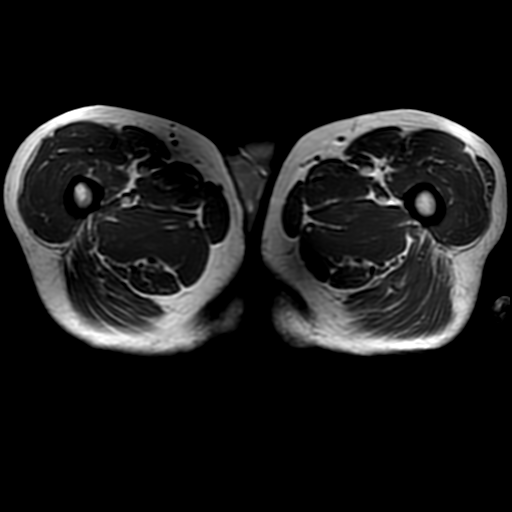

[Series 4: bSSFP fat-sat · axial · 8.0mm · 0.78mm/px · 1 of 28 slices shown]
[im 1/28]
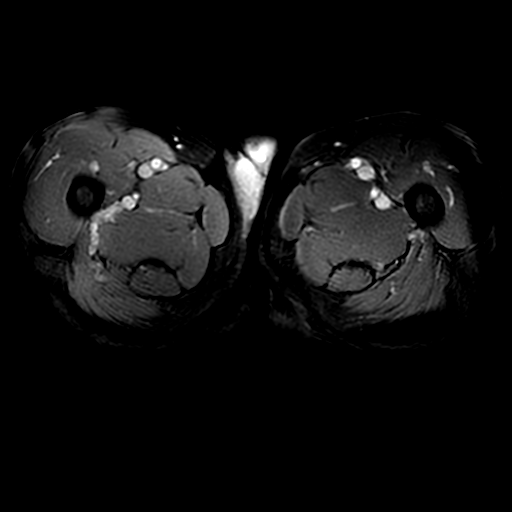

[Series 6: T2 · sagittal · 3.5mm · 0.62mm/px · 1 of 37 slices shown (1 of 4)]
[im 1/37]
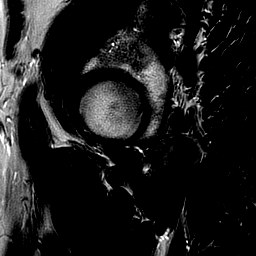

[Series 7: T1 · axial · 3.0mm · 0.31mm/px · 1 of 24 slices shown (2 of 2)]
[im 1/24]
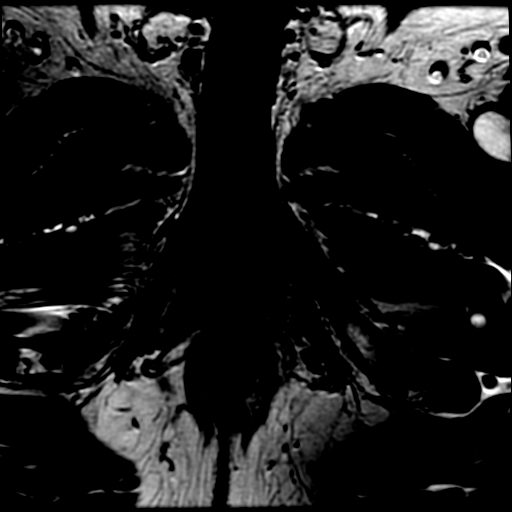

[Series 8: T2 · sagittal · 3.5mm · 0.62mm/px · 1 of 37 slices shown (2 of 4)]
[im 1/37]
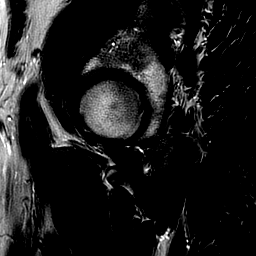

[Series 9: T2 · axial · 3.0mm · 0.62mm/px · 1 of 24 slices shown (3 of 4)]
[im 1/24]
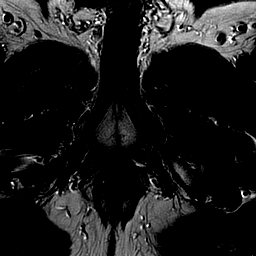

[Series 10: DWI · axial · 3.0mm · 0.70mm/px · 1 of 45 slices shown]
[im 1/45]
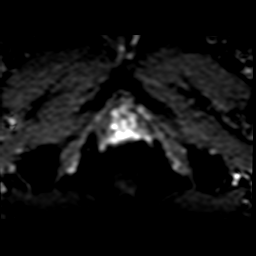

[Series 11: T2 · coronal · 3.0mm · 0.62mm/px · 1 of 24 slices shown (4 of 4)]
[im 1/24]
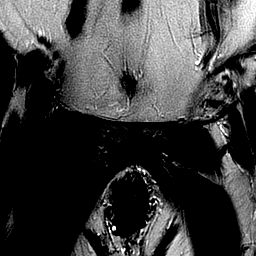

[Series 1050: ADC · axial · 3.0mm · 0.70mm/px · 1 of 15 slices shown]
[im 1/15]
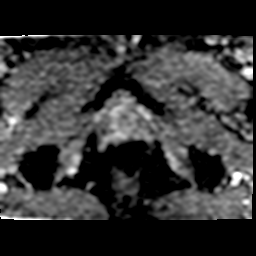

[Series 1200: T1 dynamic · axial · 4.0mm · 0.94mm/px · 1 of 26 slices shown (1 of 24)]
[im 1/26]
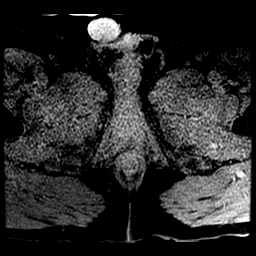

[Series 1201: T1 dynamic · axial · 4.0mm · 0.94mm/px · 1 of 26 slices shown (2 of 24)]
[im 1/26]
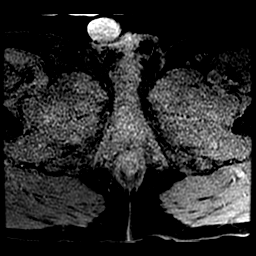

[Series 1202: T1 dynamic · axial · 4.0mm · 0.94mm/px · 1 of 26 slices shown (3 of 24)]
[im 1/26]
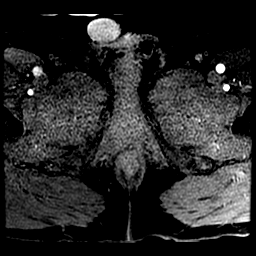

[Series 1203: T1 dynamic · axial · 4.0mm · 0.94mm/px · 1 of 26 slices shown (4 of 24)]
[im 1/26]
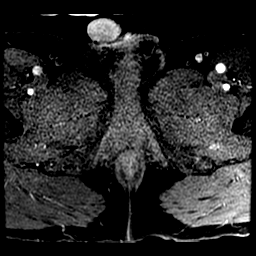

[Series 1204: T1 dynamic · axial · 4.0mm · 0.94mm/px · 1 of 26 slices shown (5 of 24)]
[im 1/26]
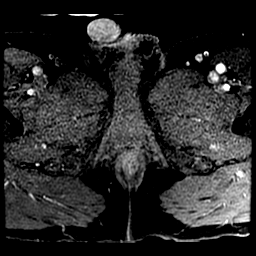

[Series 1205: T1 dynamic · axial · 4.0mm · 0.94mm/px · 1 of 26 slices shown (6 of 24)]
[im 1/26]
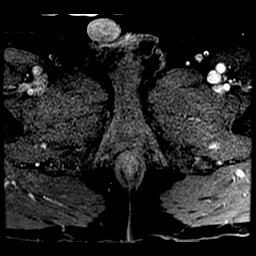

[Series 1206: T1 dynamic · axial · 4.0mm · 0.94mm/px · 1 of 26 slices shown (7 of 24)]
[im 1/26]
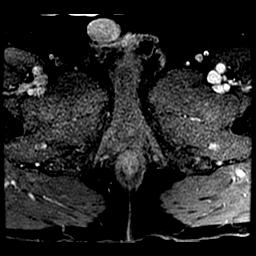

[Series 1207: T1 dynamic · axial · 4.0mm · 0.94mm/px · 1 of 26 slices shown (8 of 24)]
[im 1/26]
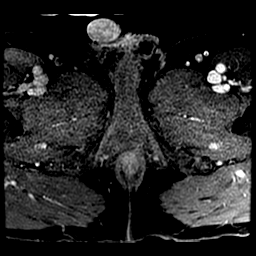

[Series 1208: T1 dynamic · axial · 4.0mm · 0.94mm/px · 1 of 26 slices shown (9 of 24)]
[im 1/26]
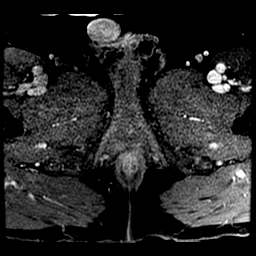

[Series 1209: T1 dynamic · axial · 4.0mm · 0.94mm/px · 1 of 26 slices shown (10 of 24)]
[im 1/26]
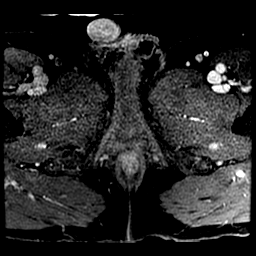

[Series 1210: T1 dynamic · axial · 4.0mm · 0.94mm/px · 1 of 26 slices shown (11 of 24)]
[im 1/26]
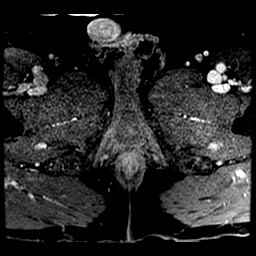

[Series 1211: T1 dynamic · axial · 4.0mm · 0.94mm/px · 1 of 26 slices shown (12 of 24)]
[im 1/26]
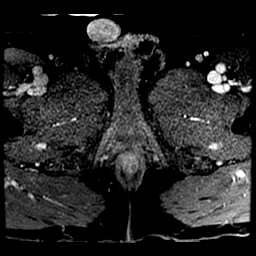

[Series 1212: T1 dynamic · axial · 4.0mm · 0.94mm/px · 1 of 26 slices shown (13 of 24)]
[im 1/26]
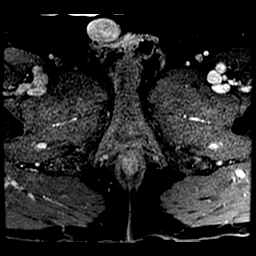

[Series 1213: T1 dynamic · axial · 4.0mm · 0.94mm/px · 1 of 26 slices shown (14 of 24)]
[im 1/26]
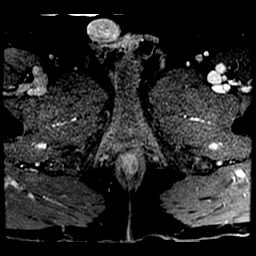

[Series 1214: T1 dynamic · axial · 4.0mm · 0.94mm/px · 1 of 26 slices shown (15 of 24)]
[im 1/26]
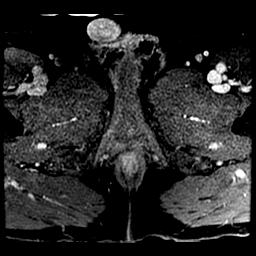

[Series 1215: T1 dynamic · axial · 4.0mm · 0.94mm/px · 1 of 26 slices shown (16 of 24)]
[im 1/26]
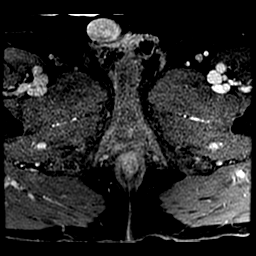

[Series 1216: T1 dynamic · axial · 4.0mm · 0.94mm/px · 1 of 26 slices shown (17 of 24)]
[im 1/26]
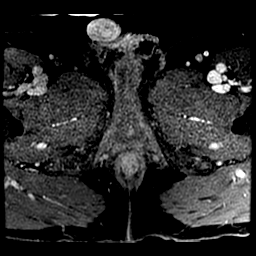

[Series 1217: T1 dynamic · axial · 4.0mm · 0.94mm/px · 1 of 26 slices shown (18 of 24)]
[im 1/26]
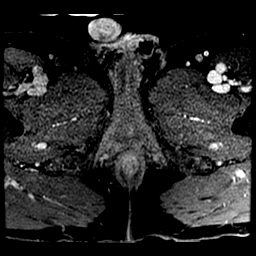

[Series 1218: T1 dynamic · axial · 4.0mm · 0.94mm/px · 1 of 26 slices shown (19 of 24)]
[im 1/26]
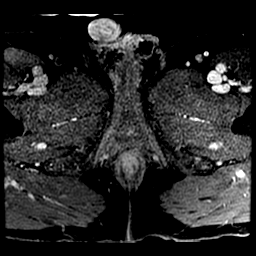

[Series 1219: T1 dynamic · axial · 4.0mm · 0.94mm/px · 1 of 26 slices shown (20 of 24)]
[im 1/26]
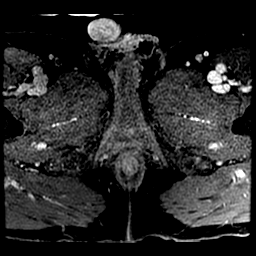

[Series 1220: T1 dynamic · axial · 4.0mm · 0.94mm/px · 1 of 26 slices shown (21 of 24)]
[im 1/26]
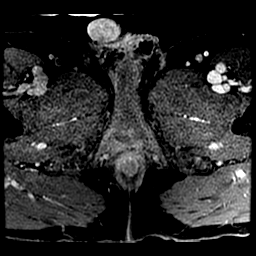

[Series 1221: T1 dynamic · axial · 4.0mm · 0.94mm/px · 1 of 26 slices shown (22 of 24)]
[im 1/26]
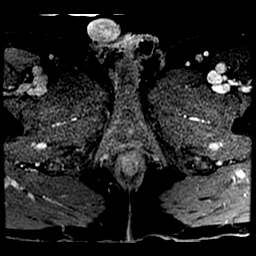

[Series 1222: T1 dynamic · axial · 4.0mm · 0.94mm/px · 1 of 26 slices shown (23 of 24)]
[im 1/26]
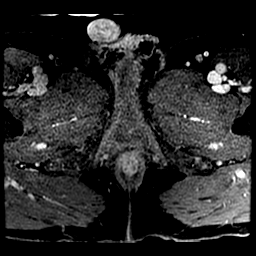

[Series 1223: T1 dynamic · axial · 4.0mm · 0.94mm/px · 1 of 26 slices shown (24 of 24)]
[im 1/26]
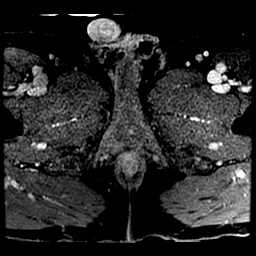

[((id)/(id)/1)-((id)/(id)/1) · axial · 4.0mm · 0.94mm/px · 1 of 7 slices shown (1 of 22)]
[im 1/7]
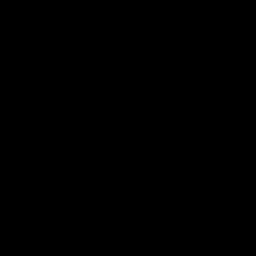

[((id)/(id)/1)-((id)/(id)/1) · axial · 4.0mm · 0.94mm/px · 1 of 26 slices shown (2 of 22)]
[im 1/26]
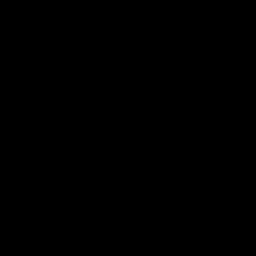

[((id)/(id)/1)-((id)/(id)/1) · axial · 4.0mm · 0.94mm/px · 1 of 26 slices shown (3 of 22)]
[im 1/26]
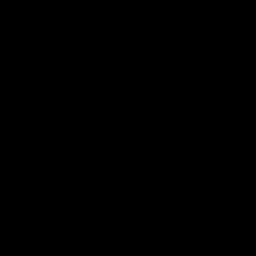

[((id)/(id)/1)-((id)/(id)/1) · axial · 4.0mm · 0.94mm/px · 1 of 26 slices shown (4 of 22)]
[im 1/26]
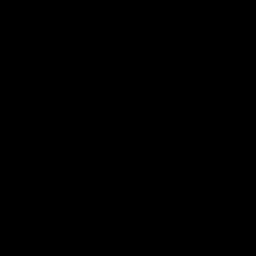

[((id)/(id)/1)-((id)/(id)/1) · axial · 4.0mm · 0.94mm/px · 1 of 26 slices shown (5 of 22)]
[im 1/26]
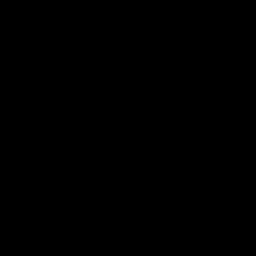

[((id)/(id)/1)-((id)/(id)/1) · axial · 4.0mm · 0.94mm/px · 1 of 26 slices shown (6 of 22)]
[im 1/26]
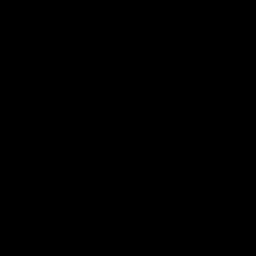

[((id)/(id)/1)-((id)/(id)/1) · axial · 4.0mm · 0.94mm/px · 1 of 26 slices shown (7 of 22)]
[im 1/26]
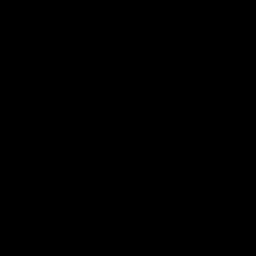

[((id)/(id)/1)-((id)/(id)/1) · axial · 4.0mm · 0.94mm/px · 1 of 26 slices shown (8 of 22)]
[im 1/26]
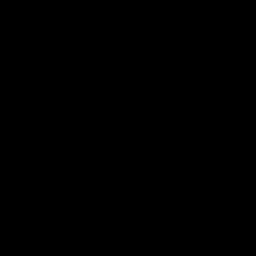

[((id)/(id)/1)-((id)/(id)/1) · axial · 4.0mm · 0.94mm/px · 1 of 26 slices shown (9 of 22)]
[im 1/26]
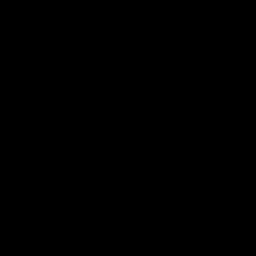

[((id)/(id)/1)-((id)/(id)/1) · axial · 4.0mm · 0.94mm/px · 1 of 26 slices shown (10 of 22)]
[im 1/26]
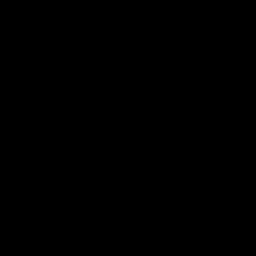

[((id)/(id)/1)-((id)/(id)/1) · axial · 4.0mm · 0.94mm/px · 1 of 26 slices shown (11 of 22)]
[im 1/26]
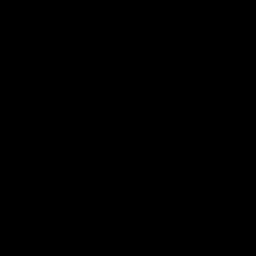

[((id)/(id)/1)-((id)/(id)/1) · axial · 4.0mm · 0.94mm/px · 1 of 26 slices shown (12 of 22)]
[im 1/26]
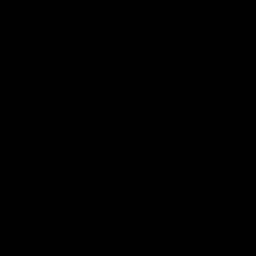

[((id)/(id)/1)-((id)/(id)/1) · axial · 4.0mm · 0.94mm/px · 1 of 26 slices shown (13 of 22)]
[im 1/26]
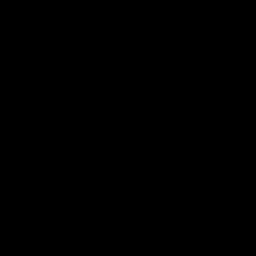

[((id)/(id)/1)-((id)/(id)/1) · axial · 4.0mm · 0.94mm/px · 1 of 26 slices shown (14 of 22)]
[im 1/26]
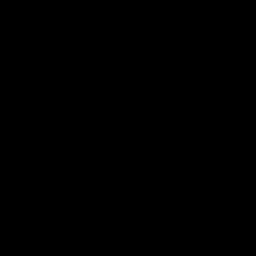

[((id)/(id)/1)-((id)/(id)/1) · axial · 4.0mm · 0.94mm/px · 1 of 26 slices shown (15 of 22)]
[im 1/26]
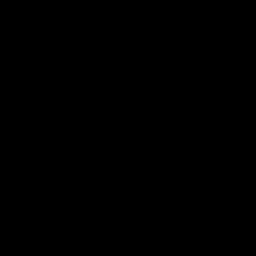

[((id)/(id)/1)-((id)/(id)/1) · axial · 4.0mm · 0.94mm/px · 1 of 26 slices shown (16 of 22)]
[im 1/26]
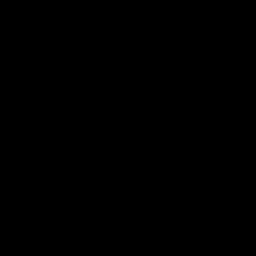

[((id)/(id)/1)-((id)/(id)/1) · axial · 4.0mm · 0.94mm/px · 1 of 26 slices shown (17 of 22)]
[im 1/26]
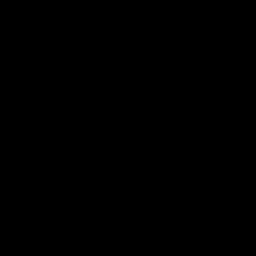

[((id)/(id)/1)-((id)/(id)/1) · axial · 4.0mm · 0.94mm/px · 1 of 26 slices shown (18 of 22)]
[im 1/26]
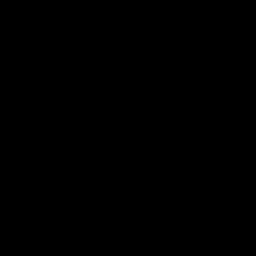

[((id)/(id)/1)-((id)/(id)/1) · axial · 4.0mm · 0.94mm/px · 1 of 26 slices shown (19 of 22)]
[im 1/26]
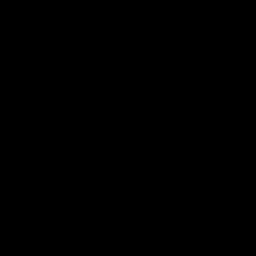

[((id)/(id)/1)-((id)/(id)/1) · axial · 4.0mm · 0.94mm/px · 1 of 26 slices shown (20 of 22)]
[im 1/26]
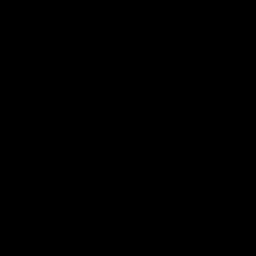

[((id)/(id)/1)-((id)/(id)/1) · axial · 4.0mm · 0.94mm/px · 1 of 26 slices shown (21 of 22)]
[im 1/26]
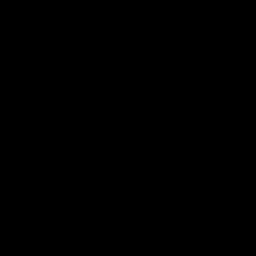

[((id)/(id)/1)-((id)/(id)/1) · axial · 4.0mm · 0.94mm/px · 1 of 26 slices shown (22 of 22)]
[im 1/26]
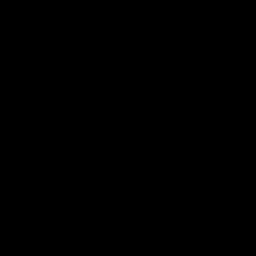

[55 of 55 positions shown; findings below may reference images not displayed]

FINDINGS: Prostate: PI-RADS category 5 lesion involving the right apex
anterior and posterolateral peripheral zone and extending into the
right mid gland posterolateral peripheral zone has low T2 and ADC
map activity, early enhancement, and accentuated high B value
diffusion-weighted signal. This lesion has a volume of 1.09 cubic cm
(1.6 by 0.5 by 1.5 cm) and is designated as region of interest # 1
for UroNAV targeting purposes.

Otherwise there is some faint reduced T2 signal in the left mid
gland which is PI-RADS category 2, probably benign. Prominent median
lobe indents the bladder base.

Volume: 3D volumetric analysis: Prostate volume 44.93 cubic cm (4.7
by 4.3 by 5.3 cm)

Transcapsular spread:  Absent

Seminal vesicle involvement: Absent

Neurovascular bundle involvement: Absent

Pelvic adenopathy: Absent

Bone metastasis: Absent

Other findings: Sigmoid colon diverticulosis.
IMPRESSION: 1. PI-RADS category 5 lesions suspicious for malignancy in the right
apex anterior and posterolateral peripheral zone and extending into
the right mid gland posterolateral peripheral zone, measuring up to
1.6 cm in long axis with a volume of 1.09 cubic cm. This lesion is
designated region of interest # 1 for UroNAV targeting purposes. No
definite extraprostatic extension.
2. Prostate gland volume 44.93 cubic cm. Prominent median lobe
indents the bladder base.
3. Sigmoid colon diverticulosis.
